# Patient Record
Sex: Female | Born: 2000 | Race: White | Hispanic: No | Marital: Single | Smoking: Never smoker
Health system: Southern US, Community
[De-identification: ages and names within clinical notes are randomized; demographics above are authoritative.]

---

## 2019-03-15 DIAGNOSIS — D649 Anemia, unspecified: Secondary | ICD-10-CM | POA: Diagnosis not present

## 2019-03-15 DIAGNOSIS — K219 Gastro-esophageal reflux disease without esophagitis: Secondary | ICD-10-CM | POA: Diagnosis not present

## 2019-03-15 DIAGNOSIS — R5383 Other fatigue: Secondary | ICD-10-CM | POA: Diagnosis not present

## 2019-03-15 DIAGNOSIS — N946 Dysmenorrhea, unspecified: Secondary | ICD-10-CM | POA: Diagnosis not present

## 2019-04-10 DIAGNOSIS — N946 Dysmenorrhea, unspecified: Secondary | ICD-10-CM | POA: Diagnosis not present

## 2019-04-10 DIAGNOSIS — D649 Anemia, unspecified: Secondary | ICD-10-CM | POA: Diagnosis not present

## 2019-04-10 DIAGNOSIS — R5383 Other fatigue: Secondary | ICD-10-CM | POA: Diagnosis not present

## 2019-04-10 DIAGNOSIS — K219 Gastro-esophageal reflux disease without esophagitis: Secondary | ICD-10-CM | POA: Diagnosis not present

## 2019-09-03 DIAGNOSIS — Z20828 Contact with and (suspected) exposure to other viral communicable diseases: Secondary | ICD-10-CM | POA: Diagnosis not present

## 2019-09-27 DIAGNOSIS — F430 Acute stress reaction: Secondary | ICD-10-CM | POA: Diagnosis not present

## 2019-09-27 DIAGNOSIS — K219 Gastro-esophageal reflux disease without esophagitis: Secondary | ICD-10-CM | POA: Diagnosis not present

## 2019-09-27 DIAGNOSIS — N946 Dysmenorrhea, unspecified: Secondary | ICD-10-CM | POA: Diagnosis not present

## 2019-09-27 DIAGNOSIS — R5383 Other fatigue: Secondary | ICD-10-CM | POA: Diagnosis not present

## 2019-09-28 DIAGNOSIS — Z03818 Encounter for observation for suspected exposure to other biological agents ruled out: Secondary | ICD-10-CM | POA: Diagnosis not present

## 2019-11-12 DIAGNOSIS — M542 Cervicalgia: Secondary | ICD-10-CM | POA: Diagnosis not present

## 2019-12-04 DIAGNOSIS — N946 Dysmenorrhea, unspecified: Secondary | ICD-10-CM | POA: Diagnosis not present

## 2019-12-04 DIAGNOSIS — R519 Headache, unspecified: Secondary | ICD-10-CM | POA: Diagnosis not present

## 2019-12-04 DIAGNOSIS — R5383 Other fatigue: Secondary | ICD-10-CM | POA: Diagnosis not present

## 2019-12-04 DIAGNOSIS — D649 Anemia, unspecified: Secondary | ICD-10-CM | POA: Diagnosis not present

## 2019-12-13 DIAGNOSIS — Z3042 Encounter for surveillance of injectable contraceptive: Secondary | ICD-10-CM | POA: Diagnosis not present

## 2020-02-21 DIAGNOSIS — N920 Excessive and frequent menstruation with regular cycle: Secondary | ICD-10-CM | POA: Diagnosis not present

## 2020-02-28 DIAGNOSIS — J029 Acute pharyngitis, unspecified: Secondary | ICD-10-CM | POA: Diagnosis not present

## 2020-02-28 DIAGNOSIS — R05 Cough: Secondary | ICD-10-CM | POA: Diagnosis not present

## 2020-02-28 DIAGNOSIS — R5383 Other fatigue: Secondary | ICD-10-CM | POA: Diagnosis not present

## 2020-02-28 DIAGNOSIS — R0981 Nasal congestion: Secondary | ICD-10-CM | POA: Diagnosis not present

## 2020-05-15 DIAGNOSIS — Z3042 Encounter for surveillance of injectable contraceptive: Secondary | ICD-10-CM | POA: Diagnosis not present

## 2020-05-26 DIAGNOSIS — J029 Acute pharyngitis, unspecified: Secondary | ICD-10-CM | POA: Diagnosis not present

## 2020-07-30 DIAGNOSIS — M25572 Pain in left ankle and joints of left foot: Secondary | ICD-10-CM | POA: Diagnosis not present

## 2020-07-31 DIAGNOSIS — Z3042 Encounter for surveillance of injectable contraceptive: Secondary | ICD-10-CM | POA: Diagnosis not present

## 2020-09-02 DIAGNOSIS — R110 Nausea: Secondary | ICD-10-CM | POA: Diagnosis not present

## 2020-09-02 DIAGNOSIS — R1010 Upper abdominal pain, unspecified: Secondary | ICD-10-CM | POA: Diagnosis not present

## 2020-09-02 DIAGNOSIS — K219 Gastro-esophageal reflux disease without esophagitis: Secondary | ICD-10-CM | POA: Diagnosis not present

## 2020-09-02 DIAGNOSIS — R1319 Other dysphagia: Secondary | ICD-10-CM | POA: Diagnosis not present

## 2020-09-14 DIAGNOSIS — R110 Nausea: Secondary | ICD-10-CM | POA: Diagnosis not present

## 2020-09-14 DIAGNOSIS — R5382 Chronic fatigue, unspecified: Secondary | ICD-10-CM | POA: Diagnosis not present

## 2020-09-14 DIAGNOSIS — R059 Cough, unspecified: Secondary | ICD-10-CM | POA: Diagnosis not present

## 2020-09-18 DIAGNOSIS — J019 Acute sinusitis, unspecified: Secondary | ICD-10-CM | POA: Diagnosis not present

## 2020-09-19 DIAGNOSIS — Z20822 Contact with and (suspected) exposure to covid-19: Secondary | ICD-10-CM | POA: Diagnosis not present

## 2020-09-19 DIAGNOSIS — U071 COVID-19: Secondary | ICD-10-CM | POA: Diagnosis not present

## 2020-10-16 DIAGNOSIS — Z3042 Encounter for surveillance of injectable contraceptive: Secondary | ICD-10-CM | POA: Diagnosis not present

## 2020-10-17 DIAGNOSIS — R1319 Other dysphagia: Secondary | ICD-10-CM | POA: Diagnosis not present

## 2020-10-17 DIAGNOSIS — K219 Gastro-esophageal reflux disease without esophagitis: Secondary | ICD-10-CM | POA: Diagnosis not present

## 2020-10-17 DIAGNOSIS — R1010 Upper abdominal pain, unspecified: Secondary | ICD-10-CM | POA: Diagnosis not present

## 2020-10-17 DIAGNOSIS — K2289 Other specified disease of esophagus: Secondary | ICD-10-CM | POA: Diagnosis not present

## 2020-11-11 DIAGNOSIS — K219 Gastro-esophageal reflux disease without esophagitis: Secondary | ICD-10-CM | POA: Diagnosis not present

## 2021-01-03 ENCOUNTER — Other Ambulatory Visit: Payer: Self-pay

## 2021-01-03 ENCOUNTER — Emergency Department (HOSPITAL_BASED_OUTPATIENT_CLINIC_OR_DEPARTMENT_OTHER): Payer: BC Managed Care – PPO

## 2021-01-03 ENCOUNTER — Emergency Department (HOSPITAL_BASED_OUTPATIENT_CLINIC_OR_DEPARTMENT_OTHER)
Admission: EM | Admit: 2021-01-03 | Discharge: 2021-01-03 | Disposition: A | Discharge status: Home / Self Care | Payer: BC Managed Care – PPO | Attending: Emergency Medicine | Admitting: Emergency Medicine

## 2021-01-03 ENCOUNTER — Encounter (HOSPITAL_BASED_OUTPATIENT_CLINIC_OR_DEPARTMENT_OTHER): Payer: Self-pay | Admitting: Emergency Medicine

## 2021-01-03 DIAGNOSIS — S0990XA Unspecified injury of head, initial encounter: Secondary | ICD-10-CM | POA: Insufficient documentation

## 2021-01-03 DIAGNOSIS — W228XXA Striking against or struck by other objects, initial encounter: Secondary | ICD-10-CM | POA: Diagnosis not present

## 2021-01-03 DIAGNOSIS — H53149 Visual discomfort, unspecified: Secondary | ICD-10-CM | POA: Insufficient documentation

## 2021-01-03 DIAGNOSIS — R519 Headache, unspecified: Secondary | ICD-10-CM | POA: Diagnosis not present

## 2021-01-03 LAB — PREGNANCY, URINE: Preg Test, Ur: NEGATIVE

## 2021-01-03 MED ORDER — ONDANSETRON 4 MG PO TBDP: 4.0000 mg | ORAL_TABLET | Freq: Three times a day (TID) | ORAL | 0 refills | Status: DC | PRN

## 2021-01-03 MED ORDER — DIPHENHYDRAMINE HCL 50 MG/ML IJ SOLN
12.5000 mg | Freq: Once | INTRAMUSCULAR | Status: AC
  Administered 2021-01-03: 12.5 mg via INTRAVENOUS
  Filled 2021-01-03: qty 1

## 2021-01-03 MED ORDER — MECLIZINE HCL 25 MG PO TABS: 25.0000 mg | ORAL_TABLET | Freq: Three times a day (TID) | ORAL | 0 refills | Status: DC | PRN

## 2021-01-03 MED ORDER — PROCHLORPERAZINE EDISYLATE 10 MG/2ML IJ SOLN
10.0000 mg | Freq: Once | INTRAMUSCULAR | Status: AC
  Administered 2021-01-03: 10 mg via INTRAVENOUS
  Filled 2021-01-03: qty 2

## 2021-01-03 MED ORDER — KETOROLAC TROMETHAMINE 15 MG/ML IJ SOLN
15.0000 mg | Freq: Once | INTRAMUSCULAR | Status: AC
  Administered 2021-01-03: 15 mg via INTRAVENOUS
  Filled 2021-01-03: qty 1

## 2021-01-03 NOTE — ED Triage Notes (Signed)
Reports she stood up today and hit the right side of her head on a rock about 3 hours ago.  Denies any LOC.  Does endorse some nausea and dizziness.

## 2021-01-03 NOTE — Discharge Instructions (Signed)
As discussed, I suspect you sustained a concussion.  I have included information on concussions please read for more information.  You may take over-the-counter ibuprofen or Tylenol as needed for your headache.  I am sending you home with nausea medication.  Take as needed.  I have included the number for the concussion clinic.  Please call to schedule an appointment if symptoms do not improve within the next week.  You may call them for recommendations in [place] or find a clinic near where you live. Return to the ER for new or worsening symptoms.

## 2021-01-03 NOTE — ED Notes (Signed)
Rayfield Citizen, PA-C in room with patient now.

## 2021-01-03 NOTE — ED Notes (Signed)
ED Provider at bedside. 

## 2021-01-03 NOTE — ED Provider Notes (Signed)
[facility] EMERGENCY DEPARTMENT Provider Note   CSN: [id number] Arrival date & time: 01/03/21  1542     History Chief Complaint  Patient presents with  . Head Injury    Kelli Jackson is a 20 y.o. female with no significant past medical history who presents to the ED after a head injury.  Patient states she was hiking prior to arrival when she hit the top portion of her head. No LOC. She is not currently on any blood thinners.  Patient endorses immediate nausea and dizziness following the head injury.  No episodes of emesis.  Patient describes dizziness as feeling off balance.  She also notes "brain fog" since the accident. She admits to right-sided headache worse with movement of her head that radiates down right side of neck.  Denies unilateral weakness, speech changes, visual changes.  She endorses photophobia and phonophobia.  No treatment prior to arrival. She admits to a previous significant head injury 1 year ago.    History obtained from patient and past medical records. No interpreter used during encounter.      History reviewed. No pertinent past medical history.  There are no problems to display for this patient.   History reviewed. No pertinent surgical history.   OB History   No obstetric history on file.     No family history on file.  Social History   Tobacco Use  . Smoking status: Never Smoker  . Smokeless tobacco: Never Used  Vaping Use  . Vaping Use: Never used  Substance Use Topics  . Alcohol use: Yes    Alcohol/week: 1.0 standard drink    Types: 1 Glasses of wine per week    Comment: 1-2 x weekly  . Drug use: Never    Home Medications Prior to Admission medications   Medication Sig Start Date End Date Taking? Authorizing Provider  meclizine (ANTIVERT) 25 MG tablet Take 1 tablet (25 mg total) by mouth 3 (three) times daily as needed for dizziness. 01/03/21  Yes Reford Olliff C, PA-C  ondansetron (ZOFRAN ODT) 4 MG disintegrating  tablet Take 1 tablet (4 mg total) by mouth every 8 (eight) hours as needed for nausea or vomiting. 01/03/21   Mannie Stabile, PA-C    Allergies    Patient has no allergy information on record.  Review of Systems   Review of Systems  Eyes: Positive for photophobia. Negative for visual disturbance.  Gastrointestinal: Positive for nausea. Negative for vomiting.  Neurological: Positive for dizziness and headaches.  All other systems reviewed and are negative.   Physical Exam Updated Vital Signs BP 113/82 (BP Location: Left Arm)   Pulse 87   Temp 98.7 F (37.1 C) (Oral)   Resp 18   Ht 5\' 6"  (1.676 m)   Wt 54.4 kg   SpO2 100%   BMI 19.37 kg/m   Physical Exam Vitals and nursing note reviewed.  Constitutional:      General: She is not in acute distress.    Appearance: She is not ill-appearing.  HENT:     Head: Normocephalic.     Comments: No hemotympanum, septal hematomas, raccoon eyes, battle sign, malocclusion.  No scalp hematomas.  No skull deformities. Eyes:     Pupils: Pupils are equal, round, and reactive to light.  Neck:     Comments: No cervical midline tenderness Cardiovascular:     Rate and Rhythm: Normal rate and regular rhythm.     Pulses: Normal pulses.     Heart sounds:  Normal heart sounds. No murmur heard. No friction rub. No gallop.   Pulmonary:     Effort: Pulmonary effort is normal.     Breath sounds: Normal breath sounds.  Abdominal:     General: Abdomen is flat. There is no distension.     Palpations: Abdomen is soft.     Tenderness: There is no abdominal tenderness. There is no guarding or rebound.  Musculoskeletal:        General: Normal range of motion.     Cervical back: Neck supple.  Skin:    General: Skin is warm and dry.  Neurological:     General: No focal deficit present.     Mental Status: She is alert.     Comments: Speech is clear, able to follow commands CN III-XII intact Normal strength in upper and lower extremities  bilaterally including dorsiflexion and plantar flexion, strong and equal grip strength Sensation grossly intact throughout Moves extremities without ataxia, coordination intact No pronator drift Ambulates without difficulty  Psychiatric:        Mood and Affect: Mood normal.        Behavior: Behavior normal.     ED Results / Procedures / Treatments   Labs (all labs ordered are listed, but only abnormal results are displayed) Labs Reviewed  PREGNANCY, URINE    EKG None  Radiology CT Head Wo Contrast  Result Date: 01/03/2021 CLINICAL DATA:  The patient hit the site of their head on a rock, now with head pain. EXAM: CT HEAD WITHOUT CONTRAST TECHNIQUE: Contiguous axial images were obtained from the base of the skull through the vertex without intravenous contrast. COMPARISON:  None. FINDINGS: Brain: No evidence of acute infarction, hemorrhage, hydrocephalus, extra-axial collection or mass lesion/mass effect. Vascular: No hyperdense vessel or unexpected calcification. Skull: Normal. Negative for fracture or focal lesion. Sinuses/Orbits: No acute finding. Other: None. IMPRESSION: No acute intracranial process. Electronically Signed   By: Romona Curls M.D.   On: 01/03/2021 17:08    Procedures Procedures   Medications Ordered in ED Medications  diphenhydrAMINE (BENADRYL) injection 12.5 mg (12.5 mg Intravenous Given 01/03/21 1736)  prochlorperazine (COMPAZINE) injection 10 mg (10 mg Intravenous Given 01/03/21 1736)  ketorolac (TORADOL) 15 MG/ML injection 15 mg (15 mg Intravenous Given 01/03/21 1736)    ED Course  I have reviewed the triage vital signs and the nursing notes.  Pertinent labs & imaging results that were available during my care of the patient were reviewed by me and considered in my medical decision making (see chart for details).    MDM Rules/Calculators/A&P                         20 year old female presents to the ED after a head injury while hiking when she hit her  head on a rock. No LOC. She is not currently on any blood thinners. She admits to a right-sided headache associated with nausea, dizziness, photophobia, and phonophobia.  Upon arrival, stable vitals.  Patient in no acute distress and non-ill-appearing.  Physical exam reassuring.  No signs of basilar skull fracture.  Normal neurological exam.  Shared decision making in regards to CT head to rule out acute abnormalities even though my suspicion is low and I discussed with patient she could have sustained a concussion which will not show up on a CT head; however, patient would prefer to obtain CT which I find to be reasonable given her worsening symptoms since the incident. Migraine cocktail  given.   CT head personally reviewed which is negative for any acute abnormalities.  Patient admits to symptomatic improvement after migraine cocktail.  Suspect patient sustained a concussion.  Concussion precautions discussed with patient. Patient discharged with symptomatic treatment. Concussion clinic number given at discharge.  Patient advised to call concussion clinic if symptoms not improved within the next week. Strict ED precautions discussed with patient. Patient states understanding and agrees to plan. Patient discharged home in no acute distress and stable vitals  Final Clinical Impression(s) / ED Diagnoses Final diagnoses:  Injury of head, initial encounter    Rx / DC Orders ED Discharge Orders         Ordered    ondansetron (ZOFRAN ODT) 4 MG disintegrating tablet  Every 8 hours PRN,   Status:  Discontinued        01/03/21 1718    meclizine (ANTIVERT) 25 MG tablet  3 times daily PRN        01/03/21 1831    ondansetron (ZOFRAN ODT) 4 MG disintegrating tablet  Every 8 hours PRN        01/03/21 1831           Jesusita Oka 01/03/21 1832    Charlynne Pander, MD 01/04/21 1500

## 2021-01-03 NOTE — ED Notes (Signed)
Patient transported to CT 

## 2021-01-03 NOTE — ED Notes (Signed)
Pt discharged to home. Discharge instructions have been discussed with patient and/or family members. Pt verbally acknowledges understanding d/c instructions, and endorses comprehension to checkout at registration before leaving.  °

## 2021-01-08 ENCOUNTER — Telehealth: Payer: Self-pay | Admitting: Family Medicine

## 2021-01-08 NOTE — Telephone Encounter (Signed)
Patient's mother called stating that the patient sustained a concussion and was advised to follow up with [place]. Please advise.

## 2021-01-09 NOTE — Telephone Encounter (Signed)
Called pt and have her scheduled for a new pt concussion visit on 01/15/21 at 2:30 pm

## 2021-01-12 NOTE — Telephone Encounter (Signed)
Patient called asking to reschedule her appointment to 05/17.  Just FYI.

## 2021-01-14 ENCOUNTER — Ambulatory Visit: Payer: BC Managed Care – PPO | Attending: Internal Medicine

## 2021-01-14 DIAGNOSIS — Z23 Encounter for immunization: Secondary | ICD-10-CM

## 2021-01-14 NOTE — Progress Notes (Signed)
   Covid-19 Vaccination Clinic  Name:  Kelli Jackson    MRN: [id number] DOB: 2001/04/25  01/14/2021  [name] was observed post Covid-19 immunization for 15 minutes without incident. She was provided with Vaccine Information Sheet and instruction to access the V-Safe system.   [name] was instructed to call 911 with any severe reactions post vaccine: Marland Kitchen Difficulty breathing  . Swelling of face and throat  . A fast heartbeat  . A bad rash all over body  . Dizziness and weakness   Immunizations Administered    Name Date Dose VIS Date Route   JANSSEN COVID-19 VACCINE 01/14/2021  8:55 AM 0.5 mL 07/02/2020 Intramuscular   Manufacturer: [employer]   Lot: [id number]   NDC: [phone]

## 2021-01-15 ENCOUNTER — Ambulatory Visit: Payer: BC Managed Care – PPO | Admitting: Family Medicine

## 2021-01-20 ENCOUNTER — Other Ambulatory Visit (HOSPITAL_BASED_OUTPATIENT_CLINIC_OR_DEPARTMENT_OTHER): Payer: Self-pay

## 2021-01-20 MED ORDER — JANSSEN COVID-19 VACCINE 0.5 ML IM SUSP
INTRAMUSCULAR | 0 refills | Status: AC
  Filled 2021-01-20: qty 0.5, 1d supply, fill #0

## 2021-01-26 NOTE — Progress Notes (Signed)
Subjective:   I, Philbert Riser, LAT, ATC acting as a scribe for Clementeen Graham, MD.  Chief Complaint: Kelli Jackson,  is a 20 y.o. female who presents for initial evaluation of head injury that occurred on 01/03/21 when she was hiking and hit the top of her head on a rock. No LOC. Pt was seen after the incident at the [facility] ED c/o nausea, feeling off balance, HA, "brain fog" photophobia, phonophobia, and dizziness. Pt was prescribed zofran and meclizine at d/c. She states that she stood up and hit the R top of her head on an overhanging rock.  Today, pt reports con't photophobia, brain fog, forgetfulness, short term memory problems and insomnia.  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  She lives in [place] and will be staying in [place] starting today for about a month  and then will be in [place] mid June.    Dx imaging: 01/03/21 Head CT   Injury date : 01/03/21 Visit #: 1   History of Present Illness:    Concussion Self-Reported Symptom Score Symptoms rated on a scale 1-6, in last 24 hours   Headache: 3    Nausea: 1  Dizziness: 0  Vomiting: 0  Balance Difficulty: 1   Trouble Falling Asleep: 4   Fatigue: 4  Sleep Less Than Usual: 5  Daytime Drowsiness: 4  Sleep More Than Usual: 0  Photophobia: 4  Phonophobia: 2  Irritability: 3  Sadness: 0  Numbness or Tingling: 0  Nervousness: 1  Feeling More Emotional: 2  Feeling Mentally Foggy: 4  Feeling Slowed Down: 4  Memory Problems: 4  Difficulty Concentrating: 3  Visual Problems: 0  Total # of Symptoms: 16/22 Total Symptom Score: 49/132  Neck Pain: No Tinnitus: No  Review of Systems: No fevers or chills  Review of History: Prior history of mild concussion that lasted about 4 days.  Objective:    Physical Examination Vitals:   01/27/21 1424  BP: 110/70  Pulse: 90  SpO2: 99%   MSK: C-spine normal motion Neuro: Alert and oriented  normal coordination and gait.  Mildly impaired balance Psych: Normal speech thought process and affect.     Imaging:  CT Head Wo Contrast  Result Date: 01/03/2021 CLINICAL DATA:  The patient hit the site of their head on a rock, now with head pain. EXAM: CT HEAD WITHOUT CONTRAST TECHNIQUE: Contiguous axial images were obtained from the base of the skull through the vertex without intravenous contrast. COMPARISON:  None. FINDINGS: Brain: No evidence of acute infarction, hemorrhage, hydrocephalus, extra-axial collection or mass lesion/mass effect. Vascular: No hyperdense vessel or unexpected calcification. Skull: Normal. Negative for fracture or focal lesion. Sinuses/Orbits: No acute finding. Other: None. IMPRESSION: No acute intracranial process. Electronically Signed   By: Romona Curls M.D.   On: 01/03/2021 17:08   I, Clementeen Graham, personally (independently) visualized and performed the interpretation of the images attached in this note.   Assessment and Plan   20 y.o. female with concussion.  Doing reasonably well.  Having difficulty with headaches and insomnia and brain fog.  Plan to treat with nortriptyline for insomnia and headache.  Advance activity as tolerated.  Recheck via phone visit in about 2 weeks.  Certainly could arrange for PT services in [place] for in [place] if needed.      Action/Discussion: Reviewed diagnosis, management options, expected outcomes, and the reasons for scheduled and emergent follow-up. Questions were adequately  answered. Patient expressed verbal understanding and agreement with the following plan.     Patient Education:  Reviewed with patient the risks (i.e, a repeat concussion, post-concussion syndrome, second-impact syndrome) of returning to play prior to complete resolution, and thoroughly reviewed the signs and symptoms of concussion.Reviewed need for complete resolution of all symptoms, with rest AND exertion, prior to return to play.  Reviewed red  flags for urgent medical evaluation: worsening symptoms, nausea/vomiting, intractable headache, musculoskeletal changes, focal neurological deficits.  [facility] Concussion Care Plan, which clearly outlines the plans stated above, was given to patient.   Level of service: Total encounter time 30 minutes including face-to-face time with the patient and, reviewing past medical record, and charting on the date of service.         After Visit Summary printed out and provided to patient as appropriate.  The above documentation has been reviewed and is accurate and complete Clementeen Graham

## 2021-01-27 ENCOUNTER — Other Ambulatory Visit: Payer: Self-pay

## 2021-01-27 ENCOUNTER — Ambulatory Visit: Payer: BC Managed Care – PPO | Admitting: Family Medicine

## 2021-01-27 ENCOUNTER — Encounter: Payer: Self-pay | Admitting: Family Medicine

## 2021-01-27 VITALS — BP 110/70 | HR 90 | Ht 66.0 in | Wt 127.6 lb

## 2021-01-27 DIAGNOSIS — S060X0A Concussion without loss of consciousness, initial encounter: Secondary | ICD-10-CM | POA: Diagnosis not present

## 2021-01-27 MED ORDER — NORTRIPTYLINE HCL 25 MG PO CAPS: 25.0000 mg | ORAL_CAPSULE | Freq: Every day | ORAL | 2 refills | Status: DC

## 2021-01-27 NOTE — Patient Instructions (Addendum)
Thank you for coming in today.  Use the nortriptyline at bedtime  Ok to advance activity as tolerated.   Recheck in about 2 weeks with a phone visit.   Keep me updated.

## 2021-02-05 DIAGNOSIS — K219 Gastro-esophageal reflux disease without esophagitis: Secondary | ICD-10-CM | POA: Diagnosis not present

## 2021-02-05 DIAGNOSIS — R110 Nausea: Secondary | ICD-10-CM | POA: Diagnosis not present

## 2021-02-05 DIAGNOSIS — R194 Change in bowel habit: Secondary | ICD-10-CM | POA: Diagnosis not present

## 2021-02-10 ENCOUNTER — Other Ambulatory Visit: Payer: Self-pay

## 2021-02-10 ENCOUNTER — Ambulatory Visit (INDEPENDENT_AMBULATORY_CARE_PROVIDER_SITE_OTHER): Payer: BC Managed Care – PPO | Admitting: Family Medicine

## 2021-02-10 DIAGNOSIS — S060X0D Concussion without loss of consciousness, subsequent encounter: Secondary | ICD-10-CM | POA: Diagnosis not present

## 2021-02-10 MED ORDER — NORTRIPTYLINE HCL 10 MG PO CAPS: 10.0000 mg | ORAL_CAPSULE | Freq: Every day | ORAL | 1 refills | Status: AC

## 2021-02-10 NOTE — Progress Notes (Signed)
    Virtual Visit  via phone Note   I connected with Kelli Jackson  today by a telephone enabled telemedicine application and verified that I am speaking with the correct person using two identifiers.  ? Location of the provider office ? Location of the patient home in [place] ? The names and roles of all persons participating in the visit.  Patient and myself   I discussed the limitations, risks, security and privacy concerns of performing an evaluation and management service by telephone and the availability of in person appointments. I also discussed with the patient that there may be a patient responsible charge related to this service. The patient expressed understanding and agreed to proceed.    I discussed the limitations of evaluation and management by telemedicine and the availability of in person appointments. The patient expressed understanding and agreed to proceed.  History of Present Illness: Kelli Jackson is a 20 y.o. female who would like to discuss concussion f/u after injuring herself while hiking on 01/03/21 when she hit the top of her head on a rock overhang.  She was last seen on 01/27/21 and noted photophobia, brain fog, forgetfulness, short term memory problems and insomnia.  She was prescribed nortriptyline.  Since her last visit, pt reports   Concussion Self-Reported Symptom Score Symptoms rated on a scale 1-6, in last 24 hours  Headache: 2    Nausea: 1 Dizziness: 0  Vomiting: 0 Balance Difficulty: 0 Trouble Falling Asleep: 1   Fatigue: 3 Sleep Less Than Usual: 1 Daytime Drowsiness: 3 Sleep More Than Usual: 2  Photophobia: 0 Phonophobia: 0 Irritability: 1 Sadness: 0 Numbness or Tingling: 0 Nervousness: 1 Feeling More Emotional: 0 Feeling Mentally Foggy: 1 Feeling Slowed Down: 1  Memory Problems: 2 Difficulty Concentrating: 1 Visual Problems: 0  Total # of Symptoms: 13/22 Total Symptom Score: 20/132  Previous  Total # of Symptoms:16/22 Previous Total Symptom Score: 49/132  Neck Pain: No Tinnitus: No   Observations/Objective: There were no vitals taken for this visit. Wt Readings from Last 5 Encounters:  01/27/21 127 lb 9.6 oz (57.9 kg)  01/03/21 120 lb (54.4 kg)   Exam:  Normal Speech.     Assessment and Plan: 20 y.o. female with concussion.  Kelli Jackson is improving.  She notes the nortriptyline has been helpful but she notes next-day sedation.  Plan to decrease nortriptyline to 10 mg at bedtime with option to increase to 20 if needed.  She is scheduled to travel to [place] starting tomorrow as I discussed some precautions.  Overall she is improving enough that I do not think a scheduled follow-up is necessary.  She can recheck back with me as needed.  PDMP not reviewed this encounter. No orders of the defined types were placed in this encounter.  Meds ordered this encounter  Medications  . nortriptyline (PAMELOR) 10 MG capsule    Sig: Take 1-2 capsules (10-20 mg total) by mouth at bedtime.    Dispense:  60 capsule    Refill:  1    Follow Up Instructions:    I discussed the assessment and treatment plan with the patient. The patient was provided an opportunity to ask questions and all were answered. The patient agreed with the plan and demonstrated an understanding of the instructions.   The patient was advised to call back or seek an in-person evaluation if the symptoms worsen or if the condition fails to improve as anticipated.  Time: 15 mins discussion

## 2021-04-19 DIAGNOSIS — N3001 Acute cystitis with hematuria: Secondary | ICD-10-CM | POA: Diagnosis not present

## 2021-05-24 DIAGNOSIS — J029 Acute pharyngitis, unspecified: Secondary | ICD-10-CM | POA: Diagnosis not present

## 2021-05-24 DIAGNOSIS — J069 Acute upper respiratory infection, unspecified: Secondary | ICD-10-CM | POA: Diagnosis not present

## 2021-05-24 DIAGNOSIS — Z20822 Contact with and (suspected) exposure to covid-19: Secondary | ICD-10-CM | POA: Diagnosis not present

## 2021-06-05 DIAGNOSIS — R059 Cough, unspecified: Secondary | ICD-10-CM | POA: Diagnosis not present

## 2021-06-05 DIAGNOSIS — J209 Acute bronchitis, unspecified: Secondary | ICD-10-CM | POA: Diagnosis not present

## 2022-02-26 DIAGNOSIS — X58XXXA Exposure to other specified factors, initial encounter: Secondary | ICD-10-CM | POA: Diagnosis not present

## 2022-02-26 DIAGNOSIS — S61411A Laceration without foreign body of right hand, initial encounter: Secondary | ICD-10-CM | POA: Diagnosis not present

## 2022-02-26 DIAGNOSIS — Z23 Encounter for immunization: Secondary | ICD-10-CM | POA: Diagnosis not present

## 2022-03-15 DIAGNOSIS — S61411A Laceration without foreign body of right hand, initial encounter: Secondary | ICD-10-CM | POA: Diagnosis not present

## 2022-03-15 DIAGNOSIS — Z4802 Encounter for removal of sutures: Secondary | ICD-10-CM | POA: Diagnosis not present

## 2022-04-22 DIAGNOSIS — J020 Streptococcal pharyngitis: Secondary | ICD-10-CM | POA: Diagnosis not present

## 2022-09-01 IMAGING — CT CT HEAD W/O CM
3 series · 16 of 47 positions shown, 19 images · non-contrast
Comparison: None.

CLINICAL DATA: The patient hit the site of their head on a rock,
now with head pain.

EXAM:
CT HEAD WITHOUT CONTRAST
TECHNIQUE: Contiguous axial images were obtained from the base of the skull
through the vertex without intravenous contrast.

[Series 2: head wo · axial · 0.46mm/px · z∈[+980,+1110]mm · 10 of 32 slices shown, 13 images]
[im 3/32  brain]
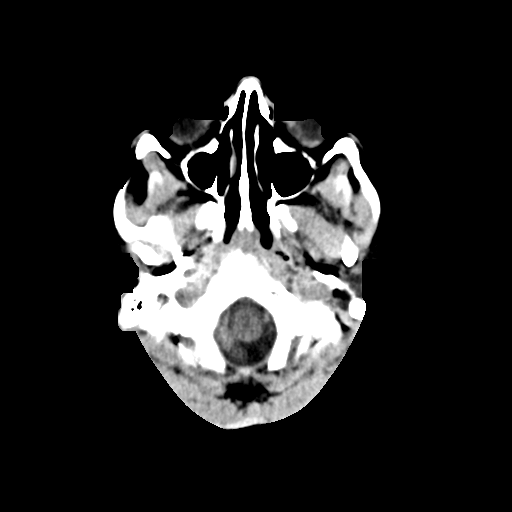
[im 3/32  bone]
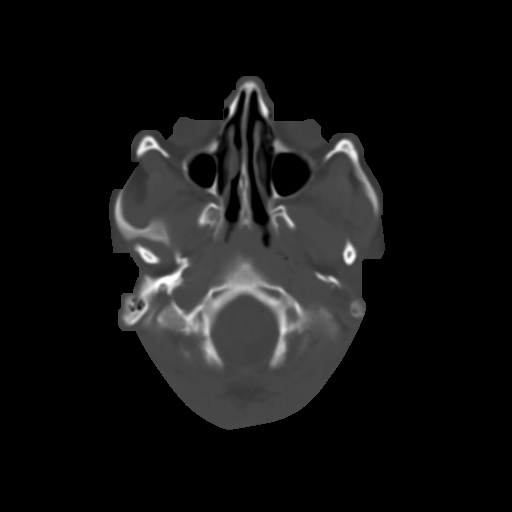
[im 6/32  brain]
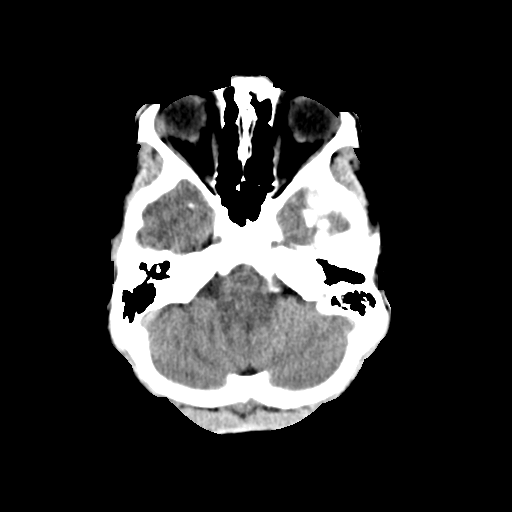
[im 9/32  brain]
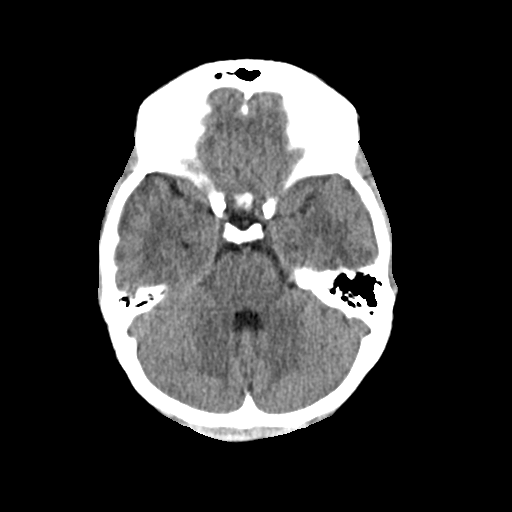
[im 11/32  brain]
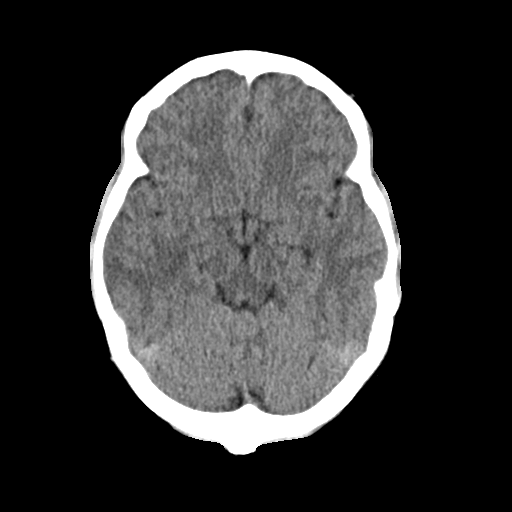
[im 14/32  brain]
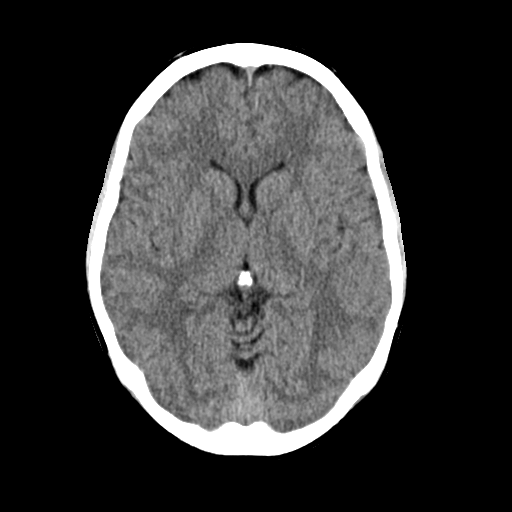
[im 14/32  bone]
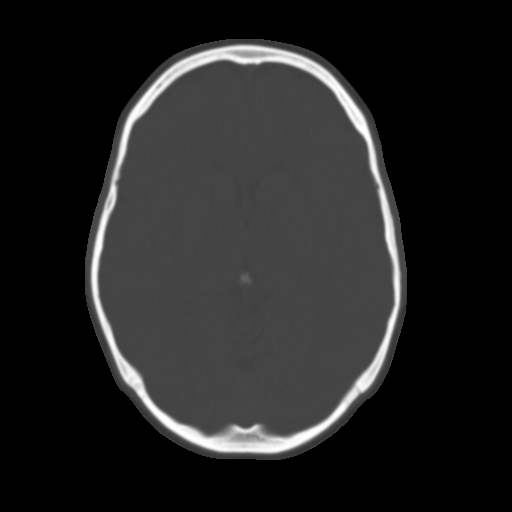
[im 18/32  brain]
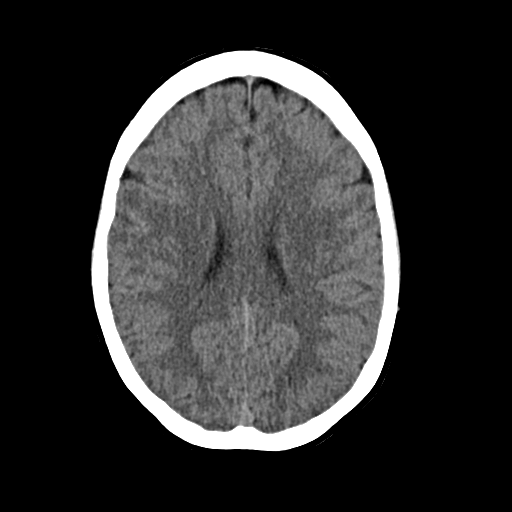
[im 21/32  brain]
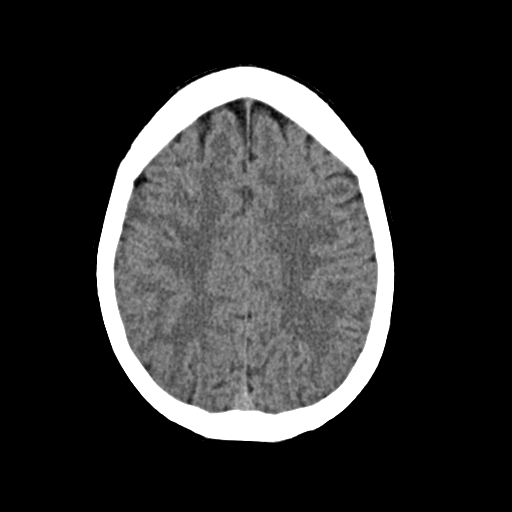
[im 24/32  brain]
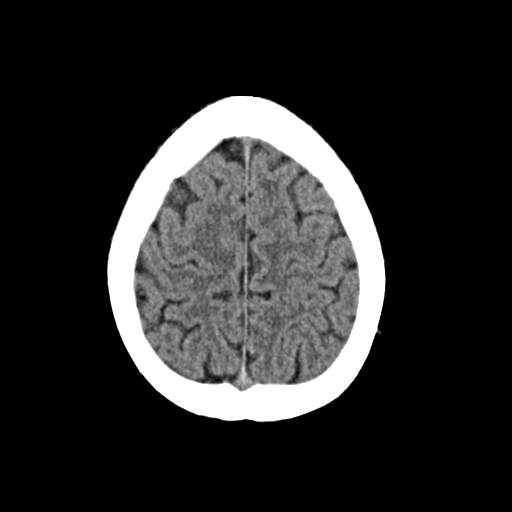
[im 26/32  brain]
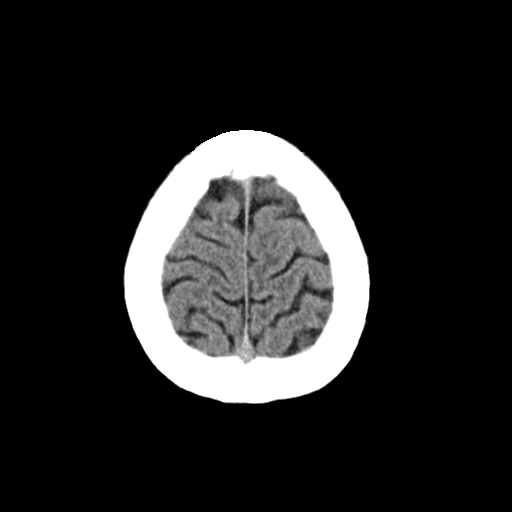
[im 26/32  bone]
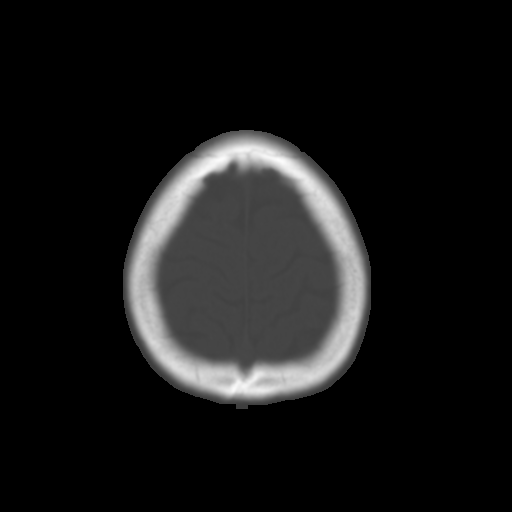
[im 29/32  brain]
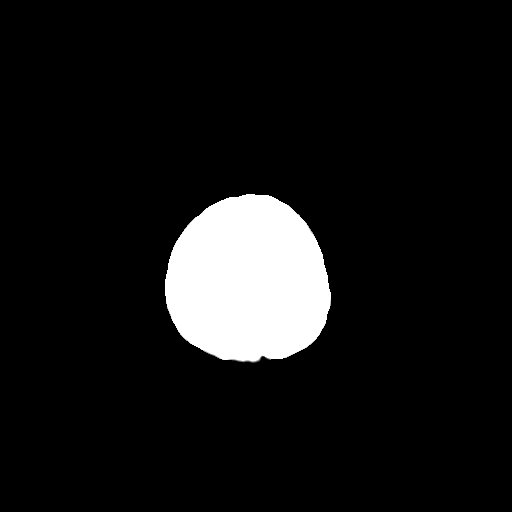

[Series 4: coronal soft · coronal · 0.34mm/px · 3 of 70 slices shown]
[im 24/70  brain]
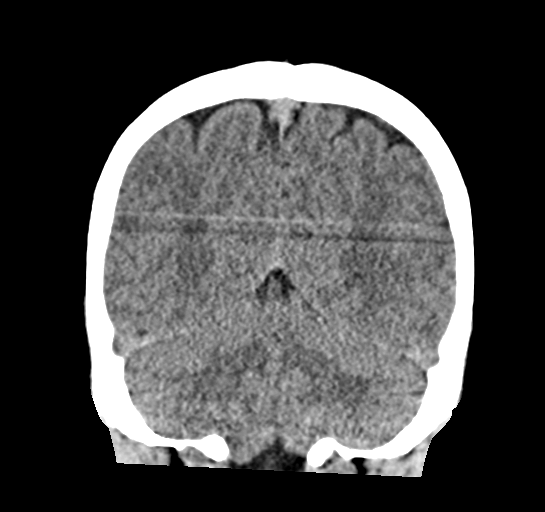
[im 31/70  brain]
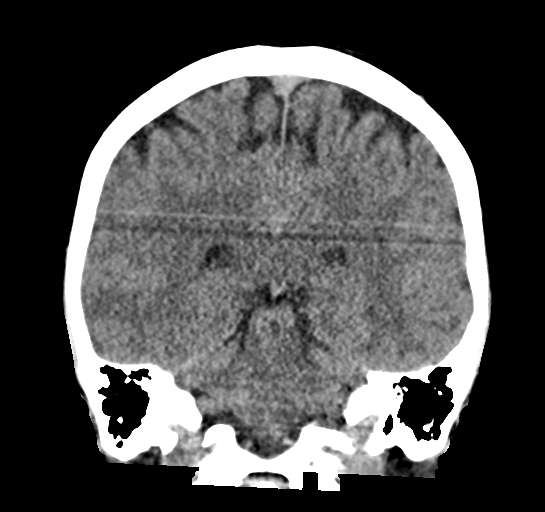
[im 39/70  brain]
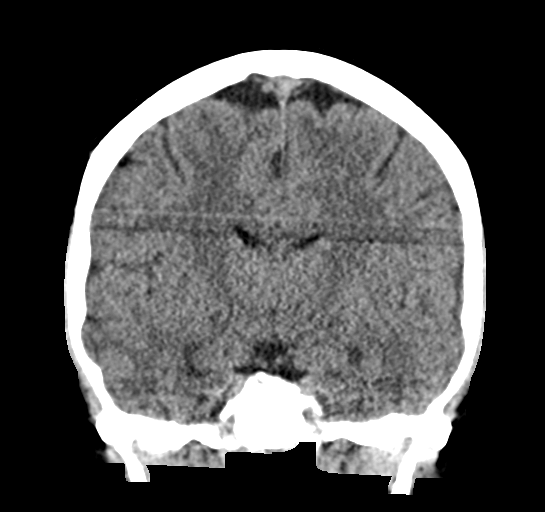

[Series 5: sag soft · sagittal · 0.37mm/px · 3 of 57 slices shown]
[im 19/57  brain]
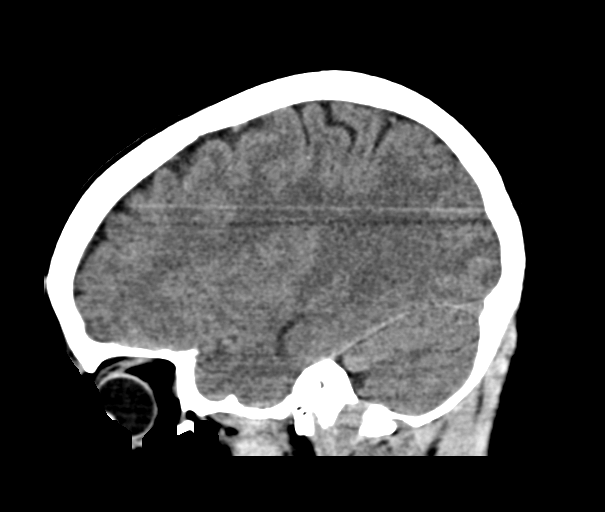
[im 29/57  brain]
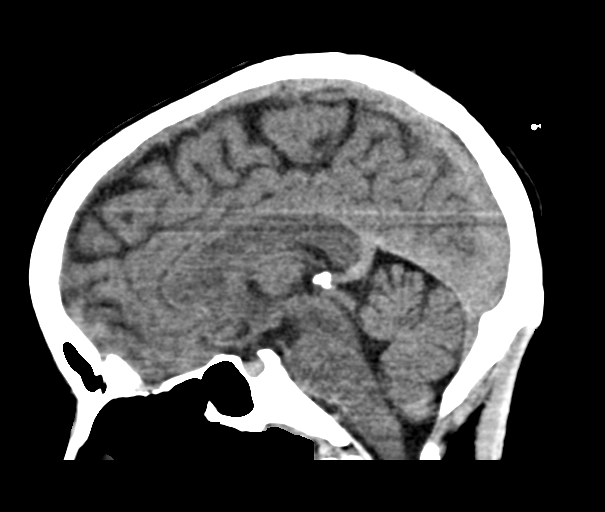
[im 38/57  brain]
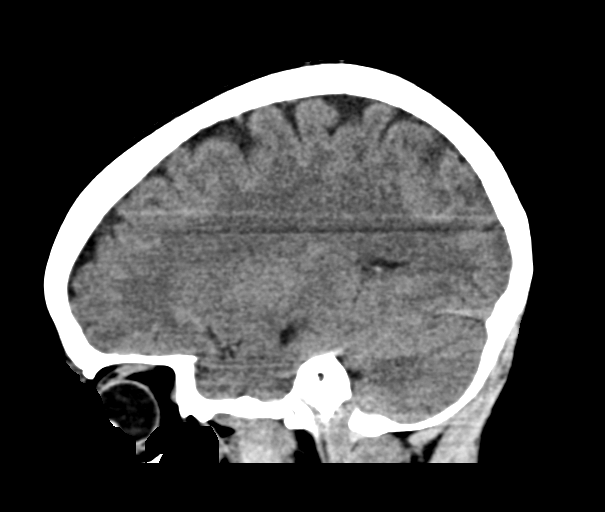

[16 of 47 positions shown; findings below may reference images not displayed]

FINDINGS: Brain: No evidence of acute infarction, hemorrhage, hydrocephalus,
extra-axial collection or mass lesion/mass effect.

Vascular: No hyperdense vessel or unexpected calcification.

Skull: Normal. Negative for fracture or focal lesion.

Sinuses/Orbits: No acute finding.

Other: None.
IMPRESSION: No acute intracranial process.
